# Patient Record
Sex: Male | Born: 1995 | Race: Black or African American | Hispanic: No | Marital: Single | Smoking: Never smoker
Health system: Southern US, Community
[De-identification: ages and names within clinical notes are randomized; demographics above are authoritative.]

## PROBLEM LIST (undated history)

## (undated) DIAGNOSIS — G039 Meningitis, unspecified: Secondary | ICD-10-CM

## (undated) HISTORY — PX: WRIST SURGERY: SHX841

---
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
## 2018-08-11 ENCOUNTER — Emergency Department (HOSPITAL_COMMUNITY): Payer: Medicaid Other

## 2018-08-11 ENCOUNTER — Other Ambulatory Visit: Payer: Self-pay

## 2018-08-11 ENCOUNTER — Emergency Department (HOSPITAL_COMMUNITY)
Admission: EM | Admit: 2018-08-11 | Discharge: 2018-08-11 | Disposition: A | Discharge status: Home / Self Care | Payer: Medicaid Other | Attending: Emergency Medicine | Admitting: Emergency Medicine

## 2018-08-11 ENCOUNTER — Encounter (HOSPITAL_COMMUNITY): Payer: Self-pay

## 2018-08-11 DIAGNOSIS — B9789 Other viral agents as the cause of diseases classified elsewhere: Secondary | ICD-10-CM

## 2018-08-11 DIAGNOSIS — J069 Acute upper respiratory infection, unspecified: Secondary | ICD-10-CM | POA: Insufficient documentation

## 2018-08-11 DIAGNOSIS — R05 Cough: Secondary | ICD-10-CM | POA: Diagnosis present

## 2018-08-11 DIAGNOSIS — J029 Acute pharyngitis, unspecified: Secondary | ICD-10-CM | POA: Diagnosis not present

## 2018-08-11 MED ORDER — ALBUTEROL SULFATE HFA 108 (90 BASE) MCG/ACT IN AERS
2.0000 | INHALATION_SPRAY | Freq: Once | RESPIRATORY_TRACT | Status: AC
  Administered 2018-08-11: 2 via RESPIRATORY_TRACT
  Filled 2018-08-11: qty 6.7

## 2018-08-11 MED ORDER — AEROCHAMBER PLUS FLO-VU LARGE MISC
Status: AC
  Filled 2018-08-11: qty 1

## 2018-08-11 MED ORDER — IPRATROPIUM-ALBUTEROL 0.5-2.5 (3) MG/3ML IN SOLN
3.0000 mL | Freq: Once | RESPIRATORY_TRACT | Status: AC
  Administered 2018-08-11: 3 mL via RESPIRATORY_TRACT
  Filled 2018-08-11: qty 3

## 2018-08-11 MED ORDER — GUAIFENESIN 100 MG/5ML PO SYRP: 100.0000 mg | ORAL_SOLUTION | ORAL | 0 refills | Status: AC | PRN

## 2018-08-11 MED ORDER — AEROCHAMBER PLUS FLO-VU SMALL MISC
1.0000 | Freq: Once | Status: AC
  Administered 2018-08-11: 1
  Filled 2018-08-11: qty 1

## 2018-08-11 MED ORDER — PROMETHAZINE-DM 6.25-15 MG/5ML PO SYRP: 5.0000 mL | ORAL_SOLUTION | Freq: Four times a day (QID) | ORAL | 0 refills | Status: AC | PRN

## 2018-08-11 NOTE — Discharge Instructions (Signed)
Thank you for allowing me to care for you today in the Emergency Department.    Your chest x-ray did not show any signs of pneumonia.  Your symptoms are most consistent with a viral upper respiratory infection.  For shortness of breath and wheezing, use 2 puffs of the albuterol inhaler with a spacer every 4 hours as needed.  Do not use it more than every 4 hours because it can cause your heart to race.  Take 5 mL's of promethazine dextromethorphan every 6 hours as needed for nasal congestion and cough.  Take 5 to 10 mL's of guaifenesin every 4 hours as needed for helping to break up mucus.  You can take 600 mg of ibuprofen with food or 650 mg of Tylenol every 6 hours as needed for fever, body aches, or headache.  If you do not have a primary care provider, you can call the number on your discharge paperwork to get established with someone if your symptoms do not improve in the next 5 to 7 days.  You should return to the emergency department if you develop difficulty breathing, respiratory distress, confusion, neck stiffness, persistent fever despite taking ibuprofen and Tylenol as described above, or other new, concerning symptoms.

## 2018-08-11 NOTE — ED Provider Notes (Signed)
[facility] EMERGENCY DEPARTMENT Provider Note   CSN: [id number] Arrival date & time: 08/11/18  1644     History   Chief Complaint Chief Complaint  Patient presents with  . Cough    HPI Christopher Waters is a 22 y.o. male with a history of meningitis who presents to the emergency department with a chief complaint of productive cough.  Patient endorses an intermittent productive cough with green sputum for the last week.  States "my mom checked my temperature with the ear thermometer and it was 105 two days ago."  No treatment for his symptoms and he denies fever since.  No aggravating or alleviating factors.  He reports associated nasal and chest congestion.  He also notes he has been having wheezing at night.  He denies shortness of breath, chest pain, chills, back pain, otalgia, neck pain or stiffness, rash, sore throat, or myalgias.  No known sick contacts.  He is a non-smoker, but states that he smokes marijuana daily.  No known sick contacts.  The history is provided by the patient. No language interpreter was used.    Past Medical History:  Diagnosis Date  . Meningitis     There are no active problems to display for this patient.   Past Surgical History:  Procedure Laterality Date  . WRIST SURGERY          Home Medications    Prior to Admission medications   Medication Sig Start Date End Date Taking? Authorizing Provider  albuterol (PROVENTIL HFA;VENTOLIN HFA) 108 (90 BASE) MCG/ACT inhaler Inhale into the lungs every 6 (six) hours as needed for wheezing or shortness of breath.    [provider]  guaifenesin (ROBITUSSIN) 100 MG/5ML syrup Take 5-10 mLs (100-200 mg total) by mouth every 4 (four) hours as needed for cough. 08/11/18   Linden Tagliaferro A, PA-C  promethazine-dextromethorphan (PROMETHAZINE-DM) 6.25-15 MG/5ML syrup Take 5 mLs by mouth 4 (four) times daily as needed for cough. 08/11/18   Dreya Buhrman A, PA-C    Family History No  family history on file.  Social History Social History   Tobacco Use  . Smoking status: Never Smoker  Substance Use Topics  . Alcohol use: Not on file  . Drug use: No     Allergies   Patient has no known allergies.   Review of Systems Review of Systems  Constitutional: Positive for fever. Negative for appetite change, chills and fatigue.  HENT: Positive for congestion. Negative for facial swelling, sinus pressure and sore throat.   Eyes: Negative for visual disturbance.  Respiratory: Positive for cough. Negative for shortness of breath.   Cardiovascular: Negative for chest pain.  Gastrointestinal: Negative for abdominal pain, diarrhea, nausea and vomiting.  Genitourinary: Negative for dysuria.  Musculoskeletal: Negative for back pain.  Skin: Negative for rash.  Allergic/Immunologic: Negative for immunocompromised state.  Neurological: Negative for weakness, numbness and headaches.  Psychiatric/Behavioral: Negative for confusion.     Physical Exam Updated Vital Signs BP 114/70 (BP Location: Right Arm)   Pulse 75   Temp 98 F (36.7 C) (Oral)   Resp 16   Ht 6\' 2"  (1.88 m)   Wt 68 kg   SpO2 99%   BMI 19.26 kg/m   Physical Exam  Constitutional: He appears well-developed. No distress.  Well appearing.   HENT:  Head: Normocephalic.  Right Ear: A middle ear effusion is present.  Left Ear: A middle ear effusion is present.  Nose: Mucosal edema and rhinorrhea present.  Right sinus exhibits no maxillary sinus tenderness and no frontal sinus tenderness. Left sinus exhibits no maxillary sinus tenderness and no frontal sinus tenderness.  Mouth/Throat: Uvula is midline. Posterior oropharyngeal erythema present. No oropharyngeal exudate, posterior oropharyngeal edema or tonsillar abscesses. No tonsillar exudate.  Eyes: Conjunctivae are normal. Right eye exhibits no discharge. Left eye exhibits no discharge.  Neck: Normal range of motion. Neck supple.  No meningismus.    Cardiovascular: Normal rate, regular rhythm, normal heart sounds and intact distal pulses. Exam reveals no gallop and no friction rub.  No murmur heard. Pulmonary/Chest: Effort normal and breath sounds normal. No stridor. No respiratory distress. He has no wheezes. He has no rales. He exhibits no tenderness.  Abdominal: Soft. He exhibits no distension.  Neurological: He is alert.  Skin: Skin is warm and dry. He is not diaphoretic.  Psychiatric: His behavior is normal.  Nursing note and vitals reviewed.    ED Treatments / Results  Labs (all labs ordered are listed, but only abnormal results are displayed) Labs Reviewed - No data to display  EKG None  Radiology Dg Chest 2 View  Result Date: 08/11/2018 CLINICAL DATA:  22 year old male with history of productive cough and tightness in the right side of the chest. Fever for 1 week. EXAM: CHEST - 2 VIEW COMPARISON:  No priors. FINDINGS: Lung volumes are normal. No consolidative airspace disease. No pleural effusions. No pneumothorax. No pulmonary nodule or mass noted. Pulmonary vasculature and the cardiomediastinal silhouette are within normal limits. IMPRESSION: No radiographic evidence of acute cardiopulmonary disease. Electronically Signed   By: Trudie Reed M.D.   On: 08/11/2018 18:12    Procedures Procedures (including critical care time)  Medications Ordered in ED Medications  AEROCHAMBER PLUS FLO-VU SMALL device MISC 1 each (has no administration in time range)  albuterol (PROVENTIL HFA;VENTOLIN HFA) 108 (90 Base) MCG/ACT inhaler 2 puff (has no administration in time range)  ipratropium-albuterol (DUONEB) 0.5-2.5 (3) MG/3ML nebulizer solution 3 mL (3 mLs Nebulization Given 08/11/18 1658)     Initial Impression / Assessment and Plan / ED Course  I have reviewed the triage vital signs and the nursing notes.  Pertinent labs & imaging results that were available during my care of the patient were reviewed by me and  considered in my medical decision making (see chart for details).     22 year old male with a history of meningitis presenting with productive cough with green sputum, intermittent wheezing, nasal and chest congestion for the last week.  States he had one day of fever 2 days ago, but denies fever since or chills.  Vital signs are reassuring in the emergency department.  On exam, lungs are clear to auscultation bilaterally.  Suspect viral URI, but will check chest x-ray given history of productive cough.  Chest x-ray is negative for pneumonia.  Doubt influenza.  Will discharge home with symptomatic treatment for cough and nasal congestion and albuterol inhaler for intermittent wheezing.  Encourage the patient to stop smoking marijuana while he is having respiratory symptoms.  Strict return precautions given.  The patient is hemodynamically stable and in no acute distress.  He is safe for discharge to home with outpatient follow-up at this time.  Final Clinical Impressions(s) / ED Diagnoses   Final diagnoses:  Viral URI with cough    ED Discharge Orders         Ordered    promethazine-dextromethorphan (PROMETHAZINE-DM) 6.25-15 MG/5ML syrup  4 times daily PRN     08/11/18 1823  guaifenesin (ROBITUSSIN) 100 MG/5ML syrup  Every 4 hours PRN     08/11/18 1823           Andruw Battie A, PA-C 08/11/18 1831    Alvira MondaySchlossman, Erin, MD 08/19/18 1319

## 2018-08-11 NOTE — ED Notes (Signed)
Patient verbalizes understanding of discharge instructions. Opportunity for questioning and answers were provided. Armband removed by staff, pt discharged from ED.  

## 2018-08-11 NOTE — ED Triage Notes (Signed)
Pt presents to ED with c/o a productive cough x1 week. Pt describes it as "hearty phlegm", states he had a fever "a few days ago", denies SOB. Endorses intermittent CP and nasal congestion.

## 2018-10-21 ENCOUNTER — Other Ambulatory Visit: Payer: Self-pay

## 2018-10-21 ENCOUNTER — Encounter (HOSPITAL_COMMUNITY): Payer: Self-pay

## 2018-10-21 ENCOUNTER — Emergency Department (HOSPITAL_COMMUNITY)
Admission: EM | Admit: 2018-10-21 | Discharge: 2018-10-21 | Disposition: A | Discharge status: Home / Self Care | Payer: 59 | Attending: Emergency Medicine | Admitting: Emergency Medicine

## 2018-10-21 DIAGNOSIS — R112 Nausea with vomiting, unspecified: Secondary | ICD-10-CM | POA: Diagnosis present

## 2018-10-21 DIAGNOSIS — R52 Pain, unspecified: Secondary | ICD-10-CM

## 2018-10-21 DIAGNOSIS — R6883 Chills (without fever): Secondary | ICD-10-CM

## 2018-10-21 DIAGNOSIS — B349 Viral infection, unspecified: Secondary | ICD-10-CM

## 2018-10-21 LAB — COMPREHENSIVE METABOLIC PANEL
ALK PHOS: 47 U/L (ref 38–126)
ALT: 9 U/L (ref 0–44)
AST: 17 U/L (ref 15–41)
Albumin: 3.8 g/dL (ref 3.5–5.0)
Anion gap: 9 (ref 5–15)
BILIRUBIN TOTAL: 1.5 mg/dL — AB (ref 0.3–1.2)
BUN: 9 mg/dL (ref 6–20)
CO2: 25 mmol/L (ref 22–32)
Calcium: 8.7 mg/dL — ABNORMAL LOW (ref 8.9–10.3)
Chloride: 104 mmol/L (ref 98–111)
Creatinine, Ser: 0.88 mg/dL (ref 0.61–1.24)
GFR calc Af Amer: >60 mL/min (ref >60)
Glucose, Bld: 85 mg/dL (ref 70–99)
POTASSIUM: 3.8 mmol/L (ref 3.5–5.1)
Sodium: 138 mmol/L (ref 135–145)
TOTAL PROTEIN: 6.1 g/dL — AB (ref 6.5–8.1)

## 2018-10-21 LAB — CBC WITH DIFFERENTIAL/PLATELET
Abs Immature Granulocytes: 0.05 10*3/uL (ref 0.00–0.07)
Basophils Absolute: 0 10*3/uL (ref 0.0–0.1)
Basophils Relative: 0 %
EOS ABS: 0 10*3/uL (ref 0.0–0.5)
EOS PCT: 0 %
HEMATOCRIT: 40.8 % (ref 39.0–52.0)
Hemoglobin: 13.5 g/dL (ref 13.0–17.0)
Immature Granulocytes: 0 %
LYMPHS ABS: 0.7 10*3/uL (ref 0.7–4.0)
Lymphocytes Relative: 6 %
MCH: 29.6 pg (ref 26.0–34.0)
MCHC: 33.1 g/dL (ref 30.0–36.0)
MCV: 89.5 fL (ref 80.0–100.0)
Monocytes Absolute: 0.9 10*3/uL (ref 0.1–1.0)
Monocytes Relative: 8 %
Neutro Abs: 9.8 10*3/uL — ABNORMAL HIGH (ref 1.7–7.7)
Neutrophils Relative %: 86 %
Platelets: 177 10*3/uL (ref 150–400)
RBC: 4.56 MIL/uL (ref 4.22–5.81)
RDW: 12.6 % (ref 11.5–15.5)
WBC: 11.5 10*3/uL — ABNORMAL HIGH (ref 4.0–10.5)
nRBC: 0 % (ref 0.0–0.2)

## 2018-10-21 LAB — LIPASE, BLOOD: Lipase: 23 U/L (ref 11–51)

## 2018-10-21 LAB — INFLUENZA PANEL BY PCR (TYPE A & B)
Influenza A By PCR: NEGATIVE
Influenza B By PCR: NEGATIVE

## 2018-10-21 MED ORDER — SODIUM CHLORIDE 0.9 % IV BOLUS
1000.0000 mL | Freq: Once | INTRAVENOUS | Status: AC
  Administered 2018-10-21: 1000 mL via INTRAVENOUS

## 2018-10-21 MED ORDER — ONDANSETRON 4 MG PO TBDP: 4.0000 mg | ORAL_TABLET | Freq: Three times a day (TID) | ORAL | 0 refills | Status: AC | PRN

## 2018-10-21 MED ORDER — ONDANSETRON HCL 4 MG/2ML IJ SOLN
4.0000 mg | Freq: Once | INTRAMUSCULAR | Status: AC
  Administered 2018-10-21: 4 mg via INTRAVENOUS
  Filled 2018-10-21: qty 2

## 2018-10-21 MED ORDER — PROMETHAZINE HCL 25 MG/ML IJ SOLN: 25.0000 mg | Freq: Once | INTRAMUSCULAR | Status: DC

## 2018-10-21 MED ORDER — ACETAMINOPHEN 500 MG PO TABS
1000.0000 mg | ORAL_TABLET | Freq: Once | ORAL | Status: AC
  Administered 2018-10-21: 1000 mg via ORAL
  Filled 2018-10-21: qty 2

## 2018-10-21 NOTE — ED Triage Notes (Signed)
Pt states that he started vomiting this morning and has felt nauseous since. States that he went to the party yesterday, "but didn't do anything." He states, "there were a lot of different breaths there and different temperatures of breaths." A&Ox4. No distress.

## 2018-10-21 NOTE — ED Provider Notes (Signed)
[facility]-EMERGENCY DEPT Provider Note   CSN: [id number] Arrival date & time: 10/21/18  [id number]     History   Chief Complaint Chief Complaint  Patient presents with  . Nausea    HPI Christopher PennaJalen J Gupta is a 23 y.o. male who presents to the ED with complaints of nausea that began last night with vomiting that began today.  Patient states that he went to a party yesterday and thought that he may have gotten too hot because he started feeling nauseated.  This morning he woke up and vomited.  He has had a total of 3-4 episodes of nonbloody nonbilious emesis.  He also reports associated chills, body aches, and generalized abdominal crampy pain that feels like "a throw up feeling".  He tried taking Excedrin without relief.  No known aggravating factors.  He did not receive his flu shot this year.  No known sick contacts.  He denies drinking alcohol or using drugs last night.  He also denies any URI symptoms, congestion, sore throat, cough, fevers, chest pain, shortness of breath, hematemesis, diarrhea, constipation, melena, hematochezia, dysuria, hematuria, numbness, tingling, focal weakness, or any other complaints at this time.  The history is provided by the patient and medical records. No language interpreter was used.    Past Medical History:  Diagnosis Date  . Meningitis     There are no active problems to display for this patient.   Past Surgical History:  Procedure Laterality Date  . WRIST SURGERY          Home Medications    Prior to Admission medications   Medication Sig Start Date End Date Taking? Authorizing Provider  albuterol (PROVENTIL HFA;VENTOLIN HFA) 108 (90 BASE) MCG/ACT inhaler Inhale into the lungs every 6 (six) hours as needed for wheezing or shortness of breath.    [provider]  guaifenesin (ROBITUSSIN) 100 MG/5ML syrup Take 5-10 mLs (100-200 mg total) by mouth every 4 (four) hours as needed for cough. 08/11/18   McDonald, Mia A,  PA-C  promethazine-dextromethorphan (PROMETHAZINE-DM) 6.25-15 MG/5ML syrup Take 5 mLs by mouth 4 (four) times daily as needed for cough. 08/11/18   McDonald, Coral ElseMia A, PA-C    Family History History reviewed. No pertinent family history.  Social History Social History   Tobacco Use  . Smoking status: Never Smoker  . Smokeless tobacco: Never Used  Substance Use Topics  . Alcohol use: Not on file  . Drug use: No     Allergies   Patient has no known allergies.   Review of Systems Review of Systems  Constitutional: Positive for chills. Negative for fever.  HENT: Negative for congestion, rhinorrhea and sore throat.   Respiratory: Negative for cough and shortness of breath.   Cardiovascular: Negative for chest pain.  Gastrointestinal: Positive for abdominal pain, nausea and vomiting. Negative for blood in stool, constipation and diarrhea.  Genitourinary: Negative for dysuria and hematuria.  Musculoskeletal: Positive for myalgias (body aches). Negative for arthralgias.  Skin: Negative for color change.  Allergic/Immunologic: Negative for immunocompromised state.  Neurological: Negative for weakness and numbness.  Psychiatric/Behavioral: Negative for confusion.   All other systems reviewed and are negative for acute change except as noted in the HPI.    Physical Exam Updated Vital Signs BP 113/72 (BP Location: Left Arm)   Pulse 86   Temp 99.6 F (37.6 C) (Oral)   Resp 16   SpO2 100%   Physical Exam Vitals signs and nursing note reviewed.  Constitutional:  General: He is not in acute distress.    Appearance: Normal appearance. He is well-developed. He is not toxic-appearing.     Comments: Low grade temp 99.6, nontoxic, NAD  HENT:     Head: Normocephalic and atraumatic.  Eyes:     General:        Right eye: No discharge.        Left eye: No discharge.     Conjunctiva/sclera: Conjunctivae normal.  Neck:     Musculoskeletal: Normal range of motion and neck supple.    Cardiovascular:     Rate and Rhythm: Normal rate and regular rhythm.     Pulses: Normal pulses.     Heart sounds: Normal heart sounds, S1 normal and S2 normal. No murmur. No friction rub. No gallop.   Pulmonary:     Effort: Pulmonary effort is normal. No respiratory distress.     Breath sounds: Normal breath sounds. No decreased breath sounds, wheezing, rhonchi or rales.  Abdominal:     General: Bowel sounds are normal. There is no distension.     Palpations: Abdomen is soft. Abdomen is not rigid.     Tenderness: There is no abdominal tenderness. There is no right CVA tenderness, left CVA tenderness, guarding or rebound. Negative signs include Murphy's sign and McBurney's sign.     Comments: Soft, NTND, +BS throughout, no r/g/r, neg murphy's, neg mcburney's, no CVA TTP   Musculoskeletal: Normal range of motion.  Skin:    General: Skin is warm and dry.     Findings: No rash.  Neurological:     Mental Status: He is alert and oriented to person, place, and time.     Sensory: Sensation is intact. No sensory deficit.     Motor: Motor function is intact.  Psychiatric:        Mood and Affect: Mood and affect normal.        Behavior: Behavior normal.      ED Treatments / Results  Labs (all labs ordered are listed, but only abnormal results are displayed) Labs Reviewed  CBC WITH DIFFERENTIAL/PLATELET - Abnormal; Notable for the following components:      Result Value   WBC 11.5 (*)    Neutro Abs 9.8 (*)    All other components within normal limits  COMPREHENSIVE METABOLIC PANEL - Abnormal; Notable for the following components:   Calcium 8.7 (*)    Total Protein 6.1 (*)    Total Bilirubin 1.5 (*)    All other components within normal limits  LIPASE, BLOOD  INFLUENZA PANEL BY PCR (TYPE A & B)    EKG None  Radiology No results found.  Procedures Procedures (including critical care time)  Medications Ordered in ED Medications  sodium chloride 0.9 % bolus 1,000 mL (0 mLs  Intravenous Stopped 10/21/18 2209)  ondansetron (ZOFRAN) injection 4 mg (4 mg Intravenous Given 10/21/18 2018)  acetaminophen (TYLENOL) tablet 1,000 mg (1,000 mg Oral Given 10/21/18 2208)     Initial Impression / Assessment and Plan / ED Course  I have reviewed the triage vital signs and the nursing notes.  Pertinent labs & imaging results that were available during my care of the patient were reviewed by me and considered in my medical decision making (see chart for details).     23 y.o. male here with nausea that began last night with vomiting that began today, chills, body aches, and gen abd discomfort. On exam, no abdominal tenderness, low grade temp 99.6, but overall fairly  well appearing. Will get labs including flu test, give fluids and zofran, and reassess shortly. Doubt need for imaging at this time.  10:26 PM CBC w/diff with very mildly elevated WBC 11.5. CMP with marginally elevated Tbili 1.5 likely from mild dehydration. Lipase WNL. Flu test neg. Pt feeling better and tolerating PO well. Likely viral illness. Doubt need for further intervention or work up at this time. Will send home with zofran, advised tylenol/motrin for pain/fever, and f/up with PCP in 1 wk for recheck. I explained the diagnosis and have given explicit precautions to return to the ER including for any other new or worsening symptoms. The patient understands and accepts the medical plan as it's been dictated and I have answered their questions. Discharge instructions concerning home care and prescriptions have been given. The patient is STABLE and is discharged to home in good condition.    Final Clinical Impressions(s) / ED Diagnoses   Final diagnoses:  Nausea and vomiting in adult patient  Body aches  Chills  Viral syndrome    ED Discharge Orders         Ordered    ondansetron (ZOFRAN ODT) 4 MG disintegrating tablet  Every 8 hours PRN     10/21/18 [address]2225           Raynell Scott, AmoMercedes, [place] JerseyPA-C 10/21/18  2226    Lorre NickAllen, Anthony, MD 10/22/18 1351

## 2018-10-21 NOTE — Discharge Instructions (Addendum)
Use Zofran as directed, as needed for nausea.  Stay well-hydrated with plenty of fluids.  Alternate between Tylenol and ibuprofen for pain or fever.  Follow-up with your regular doctor in 1 week for recheck.  Return to the ER for emergent worsening or changes in symptoms.

## 2018-11-05 ENCOUNTER — Emergency Department (HOSPITAL_COMMUNITY)
Admission: EM | Admit: 2018-11-05 | Discharge: 2018-11-05 | Disposition: A | Discharge status: Home / Self Care | Payer: Medicaid Other | Attending: Emergency Medicine | Admitting: Emergency Medicine

## 2018-11-05 ENCOUNTER — Encounter (HOSPITAL_COMMUNITY): Payer: Self-pay

## 2018-11-05 DIAGNOSIS — Z5321 Procedure and treatment not carried out due to patient leaving prior to being seen by health care provider: Secondary | ICD-10-CM | POA: Diagnosis not present

## 2018-11-05 DIAGNOSIS — R0981 Nasal congestion: Secondary | ICD-10-CM | POA: Insufficient documentation

## 2018-11-05 NOTE — ED Triage Notes (Signed)
Patient complains of 2 days of congestion with frontal headache. Just recently treated for URI and using the inhaler with some relief. Reports chills with same, NAD

## 2018-11-05 NOTE — ED Notes (Signed)
Pt let staff know they were leaving because of the wait time.

## 2019-12-13 IMAGING — CR DG CHEST 2V
2 series · 2 of 2 positions shown · non-contrast
Comparison: No priors.

CLINICAL DATA: 22-year-old male with history of productive cough
and tightness in the right side of the chest. Fever for 1 week.

EXAM:
CHEST - 2 VIEW

[chest pa]
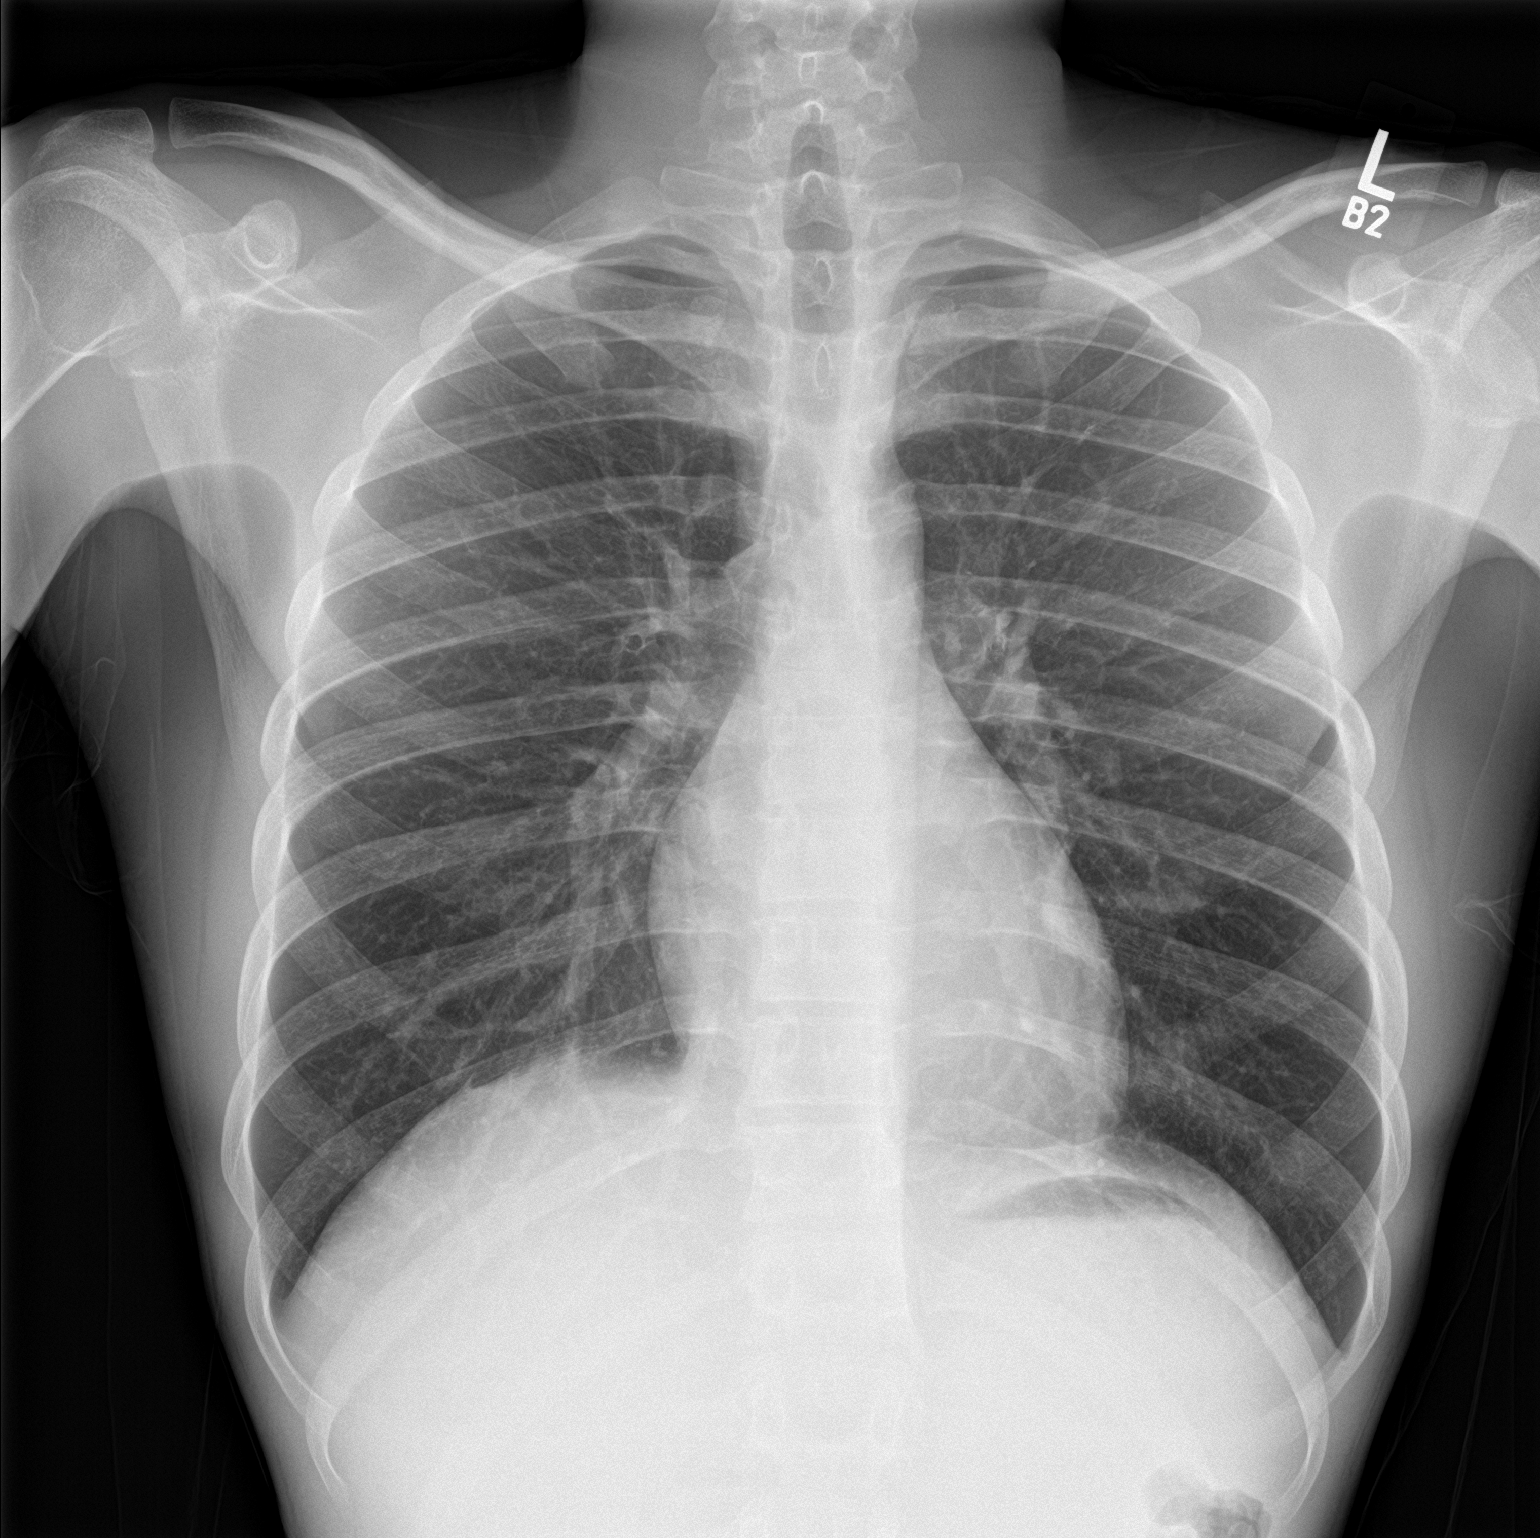

[chest lat]
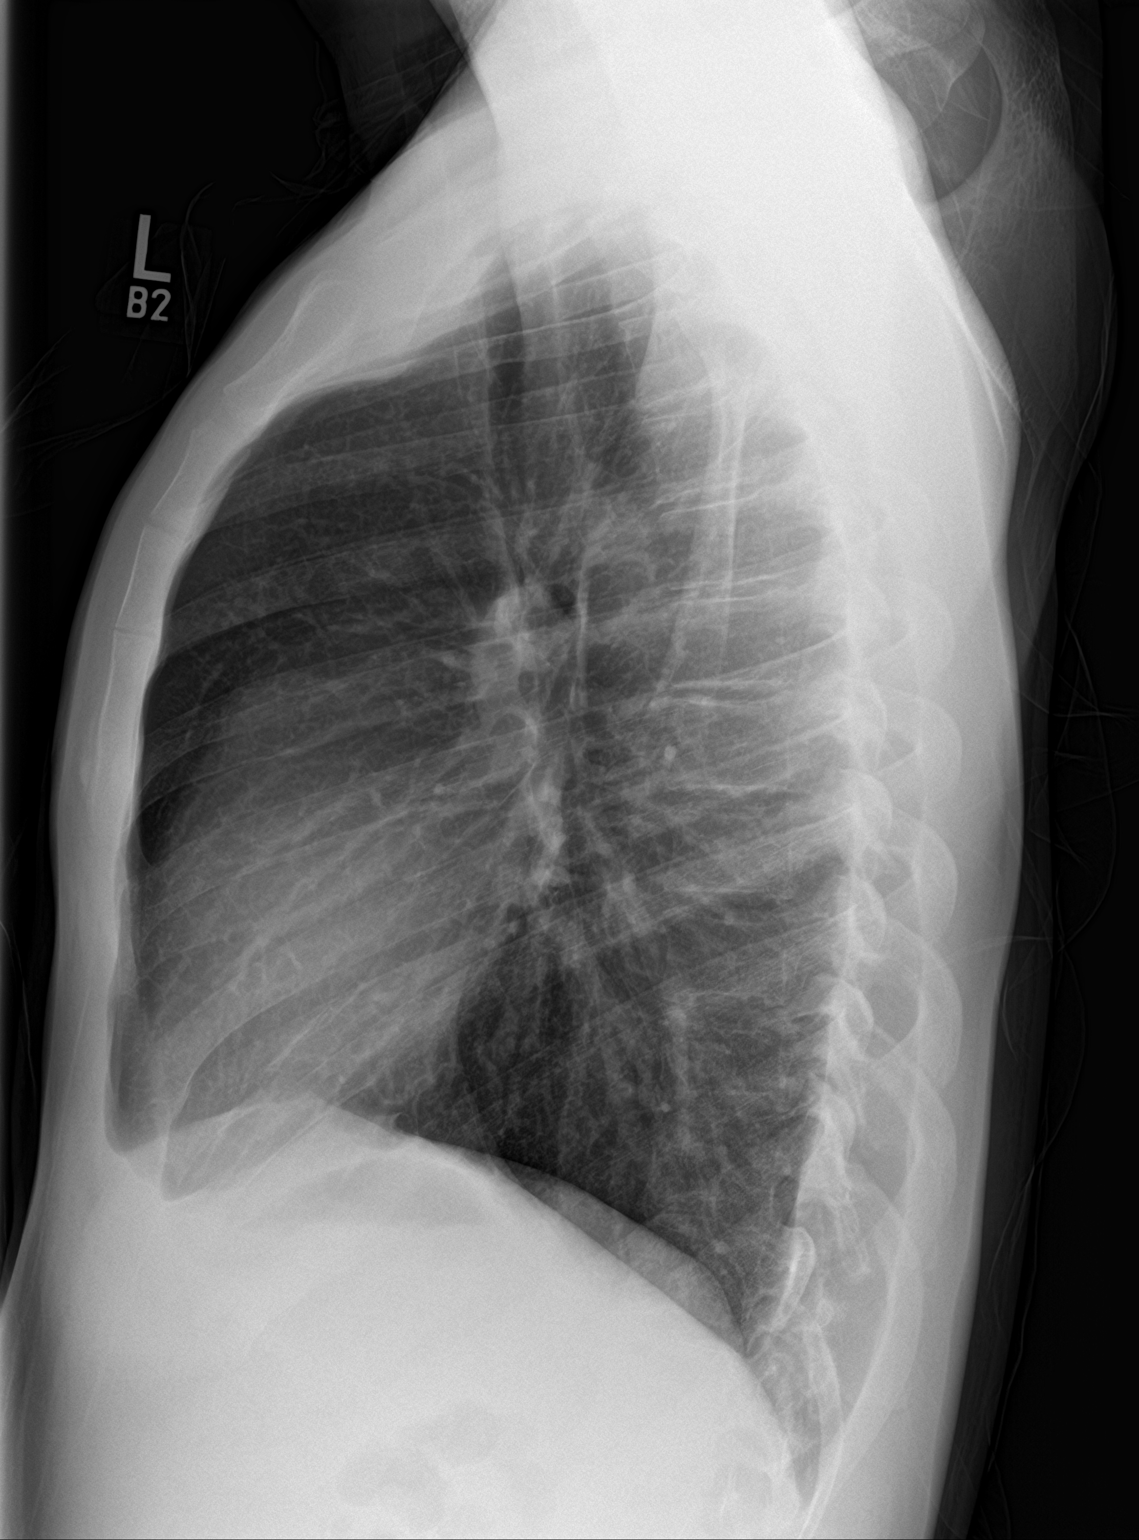

[2 of 2 positions shown; findings below may reference images not displayed]

FINDINGS: Lung volumes are normal. No consolidative airspace disease. No
pleural effusions. No pneumothorax. No pulmonary nodule or mass
noted. Pulmonary vasculature and the cardiomediastinal silhouette
are within normal limits.
IMPRESSION: No radiographic evidence of acute cardiopulmonary disease.

## 2020-04-16 DIAGNOSIS — U071 COVID-19: Secondary | ICD-10-CM | POA: Diagnosis not present

## 2020-04-16 DIAGNOSIS — R110 Nausea: Secondary | ICD-10-CM | POA: Diagnosis not present

## 2020-04-16 DIAGNOSIS — R109 Unspecified abdominal pain: Secondary | ICD-10-CM | POA: Diagnosis not present

## 2021-12-26 ENCOUNTER — Emergency Department (HOSPITAL_BASED_OUTPATIENT_CLINIC_OR_DEPARTMENT_OTHER)
Admission: EM | Admit: 2021-12-26 | Discharge: 2021-12-26 | Disposition: A | Discharge status: Home / Self Care | Payer: Medicaid Other | Attending: Emergency Medicine | Admitting: Emergency Medicine

## 2021-12-26 ENCOUNTER — Other Ambulatory Visit: Payer: Self-pay

## 2021-12-26 ENCOUNTER — Encounter (HOSPITAL_BASED_OUTPATIENT_CLINIC_OR_DEPARTMENT_OTHER): Payer: Self-pay | Admitting: Emergency Medicine

## 2021-12-26 DIAGNOSIS — Z20822 Contact with and (suspected) exposure to covid-19: Secondary | ICD-10-CM | POA: Insufficient documentation

## 2021-12-26 DIAGNOSIS — R059 Cough, unspecified: Secondary | ICD-10-CM | POA: Diagnosis present

## 2021-12-26 DIAGNOSIS — J069 Acute upper respiratory infection, unspecified: Secondary | ICD-10-CM | POA: Insufficient documentation

## 2021-12-26 DIAGNOSIS — B9789 Other viral agents as the cause of diseases classified elsewhere: Secondary | ICD-10-CM | POA: Diagnosis not present

## 2021-12-26 LAB — RESP PANEL BY RT-PCR (FLU A&B, COVID) ARPGX2
Influenza A by PCR: NEGATIVE
Influenza B by PCR: NEGATIVE
SARS Coronavirus 2 by RT PCR: NEGATIVE

## 2021-12-26 MED ORDER — FLUTICASONE PROPIONATE 50 MCG/ACT NA SUSP: 1.0000 | Freq: Every day | NASAL | 0 refills | Status: AC

## 2021-12-26 MED ORDER — BENZONATATE 100 MG PO CAPS: 100.0000 mg | ORAL_CAPSULE | Freq: Three times a day (TID) | ORAL | 0 refills | Status: AC

## 2021-12-26 NOTE — ED Triage Notes (Signed)
Pt arrives pov with c/o cough and nasal congestion and drainage x 2 days. Denies fever ?

## 2021-12-26 NOTE — Discharge Instructions (Signed)
Your COVID/Flu test came back negative. This is likely a viral upper respiratory infection. This will likely improve without any intervention. I have prescribed you medication to help with your cough and nasal congestion. You can also try over the counter products. Feel free to return if you feel like you aren't improving. ?

## 2021-12-26 NOTE — ED Provider Notes (Signed)
?[facility] EMERGENCY DEPARTMENT ?Provider Note ? ? ?CSN: [id number] ?Arrival date & time: 12/26/21  1045 ? ?  ? ?History ?PMH: n/a ?Chief Complaint  ?Patient presents with  ? Cough  ? ? ?Christopher Waters is a 26 y.o. male. ? ?Presents to the ED with 2 days of cough, congestion.Marland Kitchen  He says his cough is productive with clear sputum.  His nose is very congested.  He denies any sore throat, ear pain, headache, fevers, shortness of breath, or chest pain.  Family members have also been sick.  Has not tried anything at home for symptoms. ? ?Cough ?Associated symptoms: rhinorrhea   ?Associated symptoms: no ear pain, no fever, no headaches and no sore throat   ? ?  ? ?Home Medications ?Prior to Admission medications   ?Medication Sig Start Date End Date Taking? Authorizing Provider  ?benzonatate (TESSALON) 100 MG capsule Take 1 capsule (100 mg total) by mouth every 8 (eight) hours for 5 days. 12/26/21 12/31/21 Yes Sharona Rovner, Adora Fridge, PA-C  ?fluticasone (FLONASE) 50 MCG/ACT nasal spray Place 1 spray into both nostrils daily for 5 days. 12/26/21 12/31/21 Yes Renatha Rosen, Adora Fridge, PA-C  ?albuterol (PROVENTIL HFA;VENTOLIN HFA) 108 (90 BASE) MCG/ACT inhaler Inhale into the lungs every 6 (six) hours as needed for wheezing or shortness of breath.    [provider]  ?guaifenesin (ROBITUSSIN) 100 MG/5ML syrup Take 5-10 mLs (100-200 mg total) by mouth every 4 (four) hours as needed for cough. ?Patient not taking: Reported on 10/21/2018 08/11/18   McDonald, Mia A, PA-C  ?ondansetron (ZOFRAN ODT) 4 MG disintegrating tablet Take 1 tablet (4 mg total) by mouth every 8 (eight) hours as needed for nausea or vomiting. 10/21/18   Street, [place], PA-C  ?promethazine-dextromethorphan (PROMETHAZINE-DM) 6.25-15 MG/5ML syrup Take 5 mLs by mouth 4 (four) times daily as needed for cough. ?Patient not taking: Reported on 10/21/2018 08/11/18   Joline Maxcy A, PA-C  ?   ? ?Allergies    ?Patient has no known allergies.   ? ?Review of Systems    ?Review of Systems  ?Constitutional:  Negative for fever.  ?HENT:  Positive for congestion and rhinorrhea. Negative for ear pain and sore throat.   ?Respiratory:  Positive for cough.   ?Neurological:  Negative for headaches.  ?All other systems reviewed and are negative. ? ?Physical Exam ?Updated Vital Signs ?BP 107/66   Pulse (!) 59   Temp 98.4 ?F (36.9 ?C) (Oral)   Resp 20   Ht 6\' 2"  (1.88 m)   Wt 65.8 kg   SpO2 100%   BMI 18.62 kg/m?  ?Physical Exam ?Vitals and nursing note reviewed.  ?Constitutional:   ?   General: He is not in acute distress. ?   Appearance: Normal appearance. He is well-developed. He is not ill-appearing, toxic-appearing or diaphoretic.  ?HENT:  ?   Head: Normocephalic and atraumatic.  ?   Right Ear: Tympanic membrane, ear canal and external ear normal. No mastoid tenderness. No hemotympanum. Tympanic membrane is not injected, perforated, erythematous or bulging.  ?   Left Ear: Tympanic membrane and external ear normal. Drainage present. No mastoid tenderness. No hemotympanum. Tympanic membrane is not injected, perforated, erythematous or bulging.  ?   Ears:  ?   Comments: Left ear: dried blood present in canal ?   Nose: Congestion present. No nasal deformity or rhinorrhea.  ?   Right Turbinates: Enlarged.  ?   Left Turbinates: Enlarged.  ?   Right Sinus: No maxillary sinus  tenderness or frontal sinus tenderness.  ?   Left Sinus: No maxillary sinus tenderness or frontal sinus tenderness.  ?   Mouth/Throat:  ?   Lips: Pink. No lesions.  ?   Mouth: Mucous membranes are moist.  ?   Pharynx: Oropharynx is clear. Uvula midline. No pharyngeal swelling, oropharyngeal exudate, posterior oropharyngeal erythema or uvula swelling.  ?   Tonsils: No tonsillar exudate or tonsillar abscesses. 0 on the right. 0 on the left.  ?Eyes:  ?   General: Gaze aligned appropriately. No scleral icterus.    ?   Right eye: No discharge.     ?   Left eye: No discharge.  ?   Conjunctiva/sclera: Conjunctivae normal.   ?   Right eye: Right conjunctiva is not injected. No exudate or hemorrhage. ?   Left eye: Left conjunctiva is not injected. No exudate or hemorrhage. ?Cardiovascular:  ?   Rate and Rhythm: Normal rate and regular rhythm.  ?   Heart sounds: Normal heart sounds, S1 normal and S2 normal. Heart sounds not distant. No murmur heard. ?  No friction rub. No gallop. No S3 or S4 sounds.  ?Pulmonary:  ?   Effort: Pulmonary effort is normal. No respiratory distress.  ?   Breath sounds: Normal breath sounds. No stridor. No decreased breath sounds, wheezing, rhonchi or rales.  ?Musculoskeletal:  ?   Right lower leg: No edema.  ?   Left lower leg: No edema.  ?Lymphadenopathy:  ?   Cervical: No cervical adenopathy.  ?Skin: ?   General: Skin is warm and dry.  ?Neurological:  ?   Mental Status: He is alert and oriented to person, place, and time.  ?Psychiatric:     ?   Mood and Affect: Mood normal.     ?   Speech: Speech normal.     ?   Behavior: Behavior normal. Behavior is cooperative.  ? ? ?ED Results / Procedures / Treatments   ?Labs ?(all labs ordered are listed, but only abnormal results are displayed) ?Labs Reviewed  ?RESP PANEL BY RT-PCR (FLU A&B, COVID) ARPGX2  ? ? ?EKG ?None ? ?Radiology ?No results found. ? ?Procedures ?Procedures  ? ?Medications Ordered in ED ?Medications - No data to display ? ?ED Course/ Medical Decision Making/ A&P ?  ?                        ?Medical Decision Making ?Risk ?Prescription drug management. ? ? ? ?MDM  ?This is a 26 y.o. male who presents to the ED with cough and nasal congestion for two days ?The differential of this patient includes but is not limited to URI, allergic rhinitis, bronchitis, PNA ? ?My Impression, Plan, and ED Course: Well-appearing male.  Vitals are stable.  HEENT exam is overall unremarkable other than some dried blood in the left ear canal.  He uses Q-tips at home and this is likely the culprit of this.  TMs are otherwise intact.  There is some turbinate swelling  bilaterally consistent with patient's complaint of congestion.  Throat exam with no evidence of PTA, or tonsillitis.  No lymphadenopathy present.  Lung sounds are clear with no evidence of pneumonia.  No respiratory distress noted. ?Do not think patient will require chest x-ray or labs.  Will obtain viral respiratory panel. ? ?I personally ordered, reviewed, and interpreted all laboratory work and imaging and agree with radiologist interpretation. Results interpreted below: Viral panel negative for flu, and COVID ? ?  Discussed negative workup with patient and overall benign presentation. This is likely viral URI. Supportive treatment recommended. Return precautions provided. ? ?Charting Requirements ?Additional history is obtained from:  Independent historian ?External Records from outside source obtained and reviewed including: last seen in ED in July 2021 for COVID 19 ?Social Determinants of Health:  none ?Pertinant PMH that complicates patient's illness: n/a ? ?Patient Care ?Problems that were addressed during this visit: ?- URI: Acute illness with systemic symptoms ?Disposition: supportive tx, return precautions ? ?Portions of this note were generated with [employer]. Dictation errors may occur despite best attempts at proofreading. ?  ? ? Final Clinical Impression(s) / ED Diagnoses ?Final diagnoses:  ?Viral URI with cough  ? ? ?Rx / DC Orders ?ED Discharge Orders   ? ?      Ordered  ?  benzonatate (TESSALON) 100 MG capsule  Every 8 hours       ? 12/26/21 1157  ?  fluticasone (FLONASE) 50 MCG/ACT nasal spray  Daily       ? 12/26/21 1157  ? ?  ?  ? ?  ? ? ?  ?Adolphus Birchwood, PA-C ?12/26/21 1410 ? ?  ?Sherwood Gambler, MD ?12/26/21 1442 ? ?

## 2021-12-26 NOTE — ED Notes (Signed)
ED Provider at bedside. 

## 2022-05-29 ENCOUNTER — Encounter (HOSPITAL_BASED_OUTPATIENT_CLINIC_OR_DEPARTMENT_OTHER): Payer: Self-pay | Admitting: Emergency Medicine

## 2022-05-29 ENCOUNTER — Emergency Department (HOSPITAL_BASED_OUTPATIENT_CLINIC_OR_DEPARTMENT_OTHER)
Admission: EM | Admit: 2022-05-29 | Discharge: 2022-05-29 | Disposition: A | Discharge status: Home / Self Care | Payer: Medicaid Other | Attending: Emergency Medicine | Admitting: Emergency Medicine

## 2022-05-29 ENCOUNTER — Other Ambulatory Visit: Payer: Self-pay

## 2022-05-29 DIAGNOSIS — J069 Acute upper respiratory infection, unspecified: Secondary | ICD-10-CM | POA: Insufficient documentation

## 2022-05-29 DIAGNOSIS — R059 Cough, unspecified: Secondary | ICD-10-CM | POA: Diagnosis present

## 2022-05-29 DIAGNOSIS — Z20822 Contact with and (suspected) exposure to covid-19: Secondary | ICD-10-CM | POA: Insufficient documentation

## 2022-05-29 DIAGNOSIS — B9789 Other viral agents as the cause of diseases classified elsewhere: Secondary | ICD-10-CM | POA: Diagnosis not present

## 2022-05-29 LAB — SARS CORONAVIRUS 2 BY RT PCR: SARS Coronavirus 2 by RT PCR: NEGATIVE

## 2022-05-29 NOTE — ED Provider Notes (Signed)
[facility] EMERGENCY DEPARTMENT Provider Note   CSN: [id number] Arrival date & time: 05/29/22  1059     History Chief Complaint  Patient presents with   Cough    HPI Christopher Waters is a 26 y.o. male presenting for fever cough congestion x3 days.  Patient is otherwise healthy up-to-date on vaccines.  Patient states that his sister-in-law had COVID earlier this week.  He also states that his son was sick at school earlier this week.  He has mild headache, nausea.  He states that symptoms improved with antipyretics.  He comes in to have the rest of his family checked out and wanted to be tested for COVID while he was here.  Patient's recorded medical, surgical, social, medication list and allergies were reviewed in the Snapshot window as part of the initial history.   Review of Systems   Review of Systems  Constitutional:  Positive for fever. Negative for chills.  HENT:  Positive for congestion. Negative for ear pain and sore throat.   Eyes:  Negative for pain and visual disturbance.  Respiratory:  Positive for cough. Negative for shortness of breath.   Cardiovascular:  Negative for chest pain and palpitations.  Gastrointestinal:  Negative for abdominal pain and vomiting.  Genitourinary:  Negative for dysuria and hematuria.  Musculoskeletal:  Negative for arthralgias and back pain.  Skin:  Negative for color change and rash.  Neurological:  Negative for seizures and syncope.  All other systems reviewed and are negative.   Physical Exam Updated Vital Signs BP 112/63 (BP Location: Left Arm)   Pulse 66   Temp 98.2 F (36.8 C) (Oral)   Resp 18   Ht 6\' 1"  (1.854 m)   Wt 68 kg   SpO2 100%   BMI 19.79 kg/m  Physical Exam Vitals and nursing note reviewed.  Constitutional:      General: He is not in acute distress.    Appearance: He is well-developed.  HENT:     Head: Normocephalic and atraumatic.  Eyes:     Conjunctiva/sclera: Conjunctivae normal.   Cardiovascular:     Rate and Rhythm: Normal rate and regular rhythm.     Heart sounds: No murmur heard. Pulmonary:     Effort: Pulmonary effort is normal. No respiratory distress.     Breath sounds: Normal breath sounds.  Abdominal:     Palpations: Abdomen is soft.     Tenderness: There is no abdominal tenderness.  Musculoskeletal:        General: No swelling.     Cervical back: Neck supple.  Skin:    General: Skin is warm and dry.     Capillary Refill: Capillary refill takes less than 2 seconds.  Neurological:     Mental Status: He is alert.  Psychiatric:        Mood and Affect: Mood normal.      ED Course/ Medical Decision Making/ A&P    Procedures Procedures   Medications Ordered in ED Medications - No data to display Medical Decision Making:   KEZIAH DROTAR is a 26 y.o. male who presented to the ED today with subjective fever, cough, congestion detailed above.    Patient placed on continuous vitals and telemetry monitoring while in ED which was reviewed periodically.   Complete initial physical exam performed, notably the patient  was hemodynamically stable in no acute distress.  Posterior oropharynx illuminated and without obvious swelling or deformity.  Patient is without neck stiffness.  Reviewed and confirmed nursing documentation for past medical history, family history, social history.    Initial Assessment:   With the patient's presentation of fever cough congestion, most likely diagnosis is developing viral upper respiratory infection. Other diagnoses were considered including (but not limited to) peritonsillar abscess, retropharyngeal abscess, pneumonia. These are considered less likely due to history of present illness and physical exam findings.   This is most consistent with an acute complicated illness Considered meningitis, however patient's symptoms, vital signs, physical exam findings including lack of meningismus seem grossly less consistent at this  time. Initial Plan:  Viral screening including COVID/flu testing to evaluate for common viral etiologies that need to be tracked Objective evaluation as below reviewed   Initial Study Results:   Laboratory  All laboratory results reviewed without evidence of clinically relevant pathology.      Final Assessment and Plan:   On reassessment, patient is ambulatory tolerating p.o. intake in no acute distress.   Patient's COVID test is negative. Patient is currently stable for outpatient care and management with no indication for hospitalization or transfer at this time.  Discussed all findings with patient expressed understanding.  Disposition:  Based on the above findings, I believe patient is stable for discharge.    Patient/family educated about specific return precautions for given chief complaint and symptoms.  Patient/family educated about follow-up with PCP.     Patient/family expressed understanding of return precautions and need for follow-up. Patient spoken to regarding all imaging and laboratory results and appropriate follow up for these results. All education provided in verbal form with additional information in written form. Time was allowed for answering of patient questions. Patient discharged.    Emergency Department Medication Summary:   Medications - No data to display         Clinical Impression:  1. Viral URI with cough      Discharge   Clinical Impression:  1. Viral URI with cough      Discharge   Final Clinical Impression(s) / ED Diagnoses Final diagnoses:  Viral URI with cough    Rx / DC Orders ED Discharge Orders     None         Glyn Ade, MD 05/29/22 1232

## 2022-05-29 NOTE — ED Triage Notes (Signed)
Pt c/o cough x 3 days. Pt has been around others with illness. 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
